# Patient Record
Sex: Female | Born: 1960 | Race: White | Hispanic: No | Marital: Married | Smoking: Current every day smoker
Health system: Southern US, Community
[De-identification: ages and names within clinical notes are randomized; demographics above are authoritative.]

## PROBLEM LIST (undated history)

## (undated) DIAGNOSIS — F419 Anxiety disorder, unspecified: Secondary | ICD-10-CM

## (undated) DIAGNOSIS — K219 Gastro-esophageal reflux disease without esophagitis: Secondary | ICD-10-CM

## (undated) DIAGNOSIS — I10 Essential (primary) hypertension: Secondary | ICD-10-CM

## (undated) DIAGNOSIS — E7800 Pure hypercholesterolemia, unspecified: Secondary | ICD-10-CM

## (undated) HISTORY — DX: Anxiety disorder, unspecified: F41.9

## (undated) HISTORY — PX: CHOLECYSTECTOMY: SHX55

## (undated) HISTORY — PX: ABDOMINAL HYSTERECTOMY: SHX81

## (undated) HISTORY — PX: TONSILLECTOMY AND ADENOIDECTOMY: SUR1326

## (undated) HISTORY — PX: DENTAL SURGERY: SHX609

---

## 1999-05-17 ENCOUNTER — Other Ambulatory Visit: Admission: RE | Admit: 1999-05-17 | Discharge: 1999-05-17 | Payer: Self-pay | Admitting: Gynecology

## 1999-05-21 ENCOUNTER — Other Ambulatory Visit: Admission: RE | Admit: 1999-05-21 | Discharge: 1999-05-21 | Payer: Self-pay | Admitting: Gynecology

## 2002-02-15 ENCOUNTER — Encounter: Admission: RE | Admit: 2002-02-15 | Discharge: 2002-02-15 | Payer: Self-pay | Admitting: Family Medicine

## 2002-02-15 ENCOUNTER — Encounter: Payer: Self-pay | Admitting: Family Medicine

## 2002-10-18 ENCOUNTER — Ambulatory Visit (HOSPITAL_COMMUNITY): Admission: RE | Admit: 2002-10-18 | Discharge: 2002-10-18 | Payer: Self-pay | Admitting: Gastroenterology

## 2002-11-04 ENCOUNTER — Other Ambulatory Visit: Admission: RE | Admit: 2002-11-04 | Discharge: 2002-11-04 | Payer: Self-pay | Admitting: Gynecology

## 2002-11-07 ENCOUNTER — Encounter (HOSPITAL_COMMUNITY): Admission: RE | Admit: 2002-11-07 | Discharge: 2002-11-08 | Payer: Self-pay | Admitting: Family Medicine

## 2002-12-30 ENCOUNTER — Encounter: Payer: Self-pay | Admitting: Gynecology

## 2003-01-02 ENCOUNTER — Inpatient Hospital Stay (HOSPITAL_COMMUNITY): Admission: RE | Admit: 2003-01-02 | Discharge: 2003-01-03 | Payer: Self-pay | Admitting: Gynecology

## 2003-08-29 ENCOUNTER — Ambulatory Visit (HOSPITAL_COMMUNITY): Admission: RE | Admit: 2003-08-29 | Discharge: 2003-08-29 | Payer: Self-pay | Admitting: Gastroenterology

## 2003-09-02 ENCOUNTER — Ambulatory Visit (HOSPITAL_COMMUNITY): Admission: RE | Admit: 2003-09-02 | Discharge: 2003-09-02 | Payer: Self-pay | Admitting: Gastroenterology

## 2003-09-18 ENCOUNTER — Observation Stay (HOSPITAL_COMMUNITY): Admission: RE | Admit: 2003-09-18 | Discharge: 2003-09-18 | Payer: Self-pay | Admitting: Surgery

## 2003-11-26 ENCOUNTER — Other Ambulatory Visit: Admission: RE | Admit: 2003-11-26 | Discharge: 2003-11-26 | Payer: Self-pay | Admitting: Gynecology

## 2004-12-06 ENCOUNTER — Encounter: Admission: RE | Admit: 2004-12-06 | Discharge: 2004-12-06 | Payer: Self-pay | Admitting: Gastroenterology

## 2005-01-04 ENCOUNTER — Other Ambulatory Visit: Admission: RE | Admit: 2005-01-04 | Discharge: 2005-01-04 | Payer: Self-pay | Admitting: Gynecology

## 2006-06-05 ENCOUNTER — Emergency Department (HOSPITAL_COMMUNITY): Admission: EM | Admit: 2006-06-05 | Discharge: 2006-06-05 | Payer: Self-pay | Admitting: Emergency Medicine

## 2011-03-21 ENCOUNTER — Emergency Department (HOSPITAL_COMMUNITY): Payer: BC Managed Care – PPO

## 2011-03-21 ENCOUNTER — Emergency Department (HOSPITAL_COMMUNITY)
Admission: EM | Admit: 2011-03-21 | Discharge: 2011-03-21 | Disposition: A | Discharge status: Home / Self Care | Payer: BC Managed Care – PPO | Attending: Emergency Medicine | Admitting: Emergency Medicine

## 2011-03-21 DIAGNOSIS — R112 Nausea with vomiting, unspecified: Secondary | ICD-10-CM | POA: Insufficient documentation

## 2011-03-21 DIAGNOSIS — R1013 Epigastric pain: Secondary | ICD-10-CM | POA: Insufficient documentation

## 2011-03-21 DIAGNOSIS — R1031 Right lower quadrant pain: Secondary | ICD-10-CM | POA: Insufficient documentation

## 2011-03-21 DIAGNOSIS — R197 Diarrhea, unspecified: Secondary | ICD-10-CM | POA: Insufficient documentation

## 2011-03-21 DIAGNOSIS — K7689 Other specified diseases of liver: Secondary | ICD-10-CM | POA: Insufficient documentation

## 2011-03-21 DIAGNOSIS — I10 Essential (primary) hypertension: Secondary | ICD-10-CM | POA: Insufficient documentation

## 2011-03-21 DIAGNOSIS — E785 Hyperlipidemia, unspecified: Secondary | ICD-10-CM | POA: Insufficient documentation

## 2011-03-21 LAB — COMPREHENSIVE METABOLIC PANEL
ALT: 65 U/L — ABNORMAL HIGH (ref 0–35)
Alkaline Phosphatase: 86 U/L (ref 39–117)
CO2: 25 mEq/L (ref 19–32)
Chloride: 99 mEq/L (ref 96–112)
GFR calc Af Amer: >60 mL/min (ref >60)
Glucose, Bld: 123 mg/dL — ABNORMAL HIGH (ref 70–99)
Potassium: 4.1 mEq/L (ref 3.5–5.1)
Sodium: 137 mEq/L (ref 135–145)
Total Protein: 7.7 g/dL (ref 6.0–8.3)

## 2011-03-21 LAB — CBC
Hemoglobin: 16.1 g/dL — ABNORMAL HIGH (ref 12.0–15.0)
MCHC: 34.7 g/dL (ref 30.0–36.0)
RBC: 5.43 MIL/uL — ABNORMAL HIGH (ref 3.87–5.11)
WBC: 14.3 10*3/uL — ABNORMAL HIGH (ref 4.0–10.5)

## 2011-03-21 LAB — URINALYSIS, ROUTINE W REFLEX MICROSCOPIC
Hgb urine dipstick: NEGATIVE
Specific Gravity, Urine: 1.026 (ref 1.005–1.030)
Urobilinogen, UA: 1 mg/dL (ref 0.0–1.0)

## 2011-03-21 LAB — DIFFERENTIAL
Basophils Absolute: 0 10*3/uL (ref 0.0–0.1)
Basophils Relative: 0 % (ref 0–1)
Neutro Abs: 11.8 10*3/uL — ABNORMAL HIGH (ref 1.7–7.7)
Neutrophils Relative %: 83 % — ABNORMAL HIGH (ref 43–77)

## 2011-03-21 MED ORDER — IOHEXOL 300 MG/ML  SOLN
100.0000 mL | Freq: Once | INTRAMUSCULAR | Status: AC | PRN
  Administered 2011-03-21: 100 mL via INTRAVENOUS

## 2011-11-01 ENCOUNTER — Other Ambulatory Visit: Payer: Self-pay | Admitting: Dermatology

## 2012-01-03 ENCOUNTER — Other Ambulatory Visit: Payer: Self-pay | Admitting: *Deleted

## 2012-01-03 DIAGNOSIS — R1011 Right upper quadrant pain: Secondary | ICD-10-CM

## 2012-01-04 ENCOUNTER — Ambulatory Visit
Admission: RE | Admit: 2012-01-04 | Discharge: 2012-01-04 | Disposition: A | Discharge status: Home / Self Care | Payer: BC Managed Care – PPO | Source: Ambulatory Visit | Attending: *Deleted | Admitting: *Deleted

## 2012-01-04 DIAGNOSIS — R1011 Right upper quadrant pain: Secondary | ICD-10-CM

## 2012-05-23 ENCOUNTER — Encounter (HOSPITAL_COMMUNITY): Payer: Self-pay

## 2012-05-23 ENCOUNTER — Emergency Department (HOSPITAL_COMMUNITY): Payer: BC Managed Care – PPO

## 2012-05-23 ENCOUNTER — Emergency Department (HOSPITAL_COMMUNITY)
Admission: EM | Admit: 2012-05-23 | Discharge: 2012-05-23 | Disposition: A | Discharge status: Home / Self Care | Payer: BC Managed Care – PPO | Attending: Emergency Medicine | Admitting: Emergency Medicine

## 2012-05-23 DIAGNOSIS — I10 Essential (primary) hypertension: Secondary | ICD-10-CM | POA: Insufficient documentation

## 2012-05-23 DIAGNOSIS — R0789 Other chest pain: Secondary | ICD-10-CM

## 2012-05-23 DIAGNOSIS — R079 Chest pain, unspecified: Secondary | ICD-10-CM | POA: Insufficient documentation

## 2012-05-23 DIAGNOSIS — R0602 Shortness of breath: Secondary | ICD-10-CM | POA: Insufficient documentation

## 2012-05-23 DIAGNOSIS — E7800 Pure hypercholesterolemia, unspecified: Secondary | ICD-10-CM | POA: Insufficient documentation

## 2012-05-23 DIAGNOSIS — K219 Gastro-esophageal reflux disease without esophagitis: Secondary | ICD-10-CM | POA: Insufficient documentation

## 2012-05-23 DIAGNOSIS — Z79899 Other long term (current) drug therapy: Secondary | ICD-10-CM | POA: Insufficient documentation

## 2012-05-23 HISTORY — DX: Pure hypercholesterolemia, unspecified: E78.00

## 2012-05-23 HISTORY — DX: Gastro-esophageal reflux disease without esophagitis: K21.9

## 2012-05-23 HISTORY — DX: Essential (primary) hypertension: I10

## 2012-05-23 LAB — CBC WITH DIFFERENTIAL/PLATELET
Eosinophils Absolute: 0.3 10*3/uL (ref 0.0–0.7)
Eosinophils Relative: 3 % (ref 0–5)
Hemoglobin: 15.6 g/dL — ABNORMAL HIGH (ref 12.0–15.0)
Lymphs Abs: 2.4 10*3/uL (ref 0.7–4.0)
MCH: 29.4 pg (ref 26.0–34.0)
MCV: 85.7 fL (ref 78.0–100.0)
Monocytes Relative: 5 % (ref 3–12)
Platelets: 301 10*3/uL (ref 150–400)
RBC: 5.31 MIL/uL — ABNORMAL HIGH (ref 3.87–5.11)

## 2012-05-23 LAB — COMPREHENSIVE METABOLIC PANEL
BUN: 15 mg/dL (ref 6–23)
Calcium: 10 mg/dL (ref 8.4–10.5)
GFR calc Af Amer: >90 mL/min (ref >90)
Glucose, Bld: 101 mg/dL — ABNORMAL HIGH (ref 70–99)
Total Protein: 7.5 g/dL (ref 6.0–8.3)

## 2012-05-23 LAB — TROPONIN I: Troponin I: <0.3 ng/mL (ref <0.30)

## 2012-05-23 MED ORDER — GI COCKTAIL ~~LOC~~
30.0000 mL | Freq: Once | ORAL | Status: AC
  Administered 2012-05-23: 30 mL via ORAL
  Filled 2012-05-23: qty 30

## 2012-05-23 NOTE — ED Notes (Signed)
Alert and oriented x4. No respiratory distress.

## 2012-05-23 NOTE — ED Provider Notes (Signed)
History     CSN: [id number]  Arrival date & time 05/23/12  0445   First MD Initiated Contact with Patient 05/23/12 [phone]      Chief Complaint  Patient presents with  . Chest Pain  . Shortness of Breath    (Consider location/radiation/quality/duration/timing/severity/associated sxs/prior treatment) HPI Comments: Patient woke from sleep with burning in chest and throat, feeling as if she couldn't breathe.  She became very anxious and 911 was called.  She is feeling better now but has never had anything like this before.  She takes prilosec daily.  No recent exertional symptoms.    Patient is a 50 y.o. female presenting with chest pain and shortness of breath. The history is provided by the patient.  Chest Pain Episode onset: 3 AM. Chest pain occurs constantly. The chest pain is unchanged. Associated with: sleeping. The severity of the pain is moderate. The quality of the pain is described as burning. The pain does not radiate. Primary symptoms include shortness of breath.    Shortness of Breath  Associated symptoms include chest pain and shortness of breath.    Past Medical History  Diagnosis Date  . Hypertension   . High cholesterol   . GERD (gastroesophageal reflux disease)     History reviewed. No pertinent past surgical history.  No family history on file.  History  Substance Use Topics  . Smoking status: Not on file  . Smokeless tobacco: Not on file  . Alcohol Use:     OB History    Grav Para Term Preterm Abortions TAB SAB Ect Mult Living                  Review of Systems  Respiratory: Positive for shortness of breath.   Cardiovascular: Positive for chest pain.  All other systems reviewed and are negative.    Allergies  Review of patient's allergies indicates no known allergies.  Home Medications   Current Outpatient Rx  Name Route Sig Dispense Refill  . LIPITOR PO Oral Take 1 tablet by mouth daily.    Marland Kitchen LISINOPRIL PO Oral Take 1 tablet by mouth  daily.      There were no vitals taken for this visit.  Physical Exam  Nursing note and vitals reviewed. Constitutional: She is oriented to person, place, and time. She appears well-developed and well-nourished. No distress.  HENT:  Head: Normocephalic and atraumatic.  Neck: Normal range of motion. Neck supple.  Cardiovascular: Normal rate and regular rhythm.  Exam reveals no gallop and no friction rub.   No murmur heard. Pulmonary/Chest: Effort normal and breath sounds normal. No respiratory distress. She has no wheezes.  Abdominal: Soft. Bowel sounds are normal. She exhibits no distension. There is no tenderness.  Musculoskeletal: Normal range of motion.  Neurological: She is alert and oriented to person, place, and time.  Skin: Skin is warm and dry. She is not diaphoretic.    ED Course  Procedures (including critical care time)  Labs Reviewed - No data to display No results found.   No diagnosis found.   Date: 05/23/2012  Rate: 90  Rhythm: normal sinus rhythm  QRS Axis: normal  Intervals: normal  ST/T Wave abnormalities: normal  Conduction Disutrbances:none  Narrative Interpretation:   Old EKG Reviewed: unchanged    MDM  The patient presents with burning in the chest and feeling short of breath that woke her from sleep.  She ate a plate of spaghetti before going to bed and I believe that  this is likely GI in nature.  Also supporting this is that she was given a GI cocktail which seemed to improve her symptoms.  The labs, ekg, and chest xray are all unremarkable.  I doubt a cardiac etiology.  I will discharge the patient to home with the instructions to increase the prilosec to twice daily.  She is to return prn if worsens.          Geoffery Lyons, MD 05/23/12 [phone]

## 2012-05-23 NOTE — ED Notes (Signed)
Pt. C/o chest pain 3/10. States that she woke from sleep with SOB, nausea, chest pain and diaphoretic. Reports burning pain in central chest, states "it feels like my acid reflux pain". Pt. Anxious. Husband at bedside.

## 2012-05-23 NOTE — ED Notes (Signed)
Per EMS, pt awoke from sleep with nausea, SOB, chest pain 9/10 and diaphoresis. Pt. Took 2, 81mg  ASA and 1 TUMS. Upon EMS arrival pt anxious with no nausea. Given 2 more 81 mg ASA. Pt chest pain 3/10.

## 2013-12-24 ENCOUNTER — Other Ambulatory Visit: Payer: Self-pay | Admitting: Internal Medicine

## 2013-12-24 ENCOUNTER — Ambulatory Visit
Admission: RE | Admit: 2013-12-24 | Discharge: 2013-12-24 | Disposition: A | Discharge status: Home / Self Care | Payer: BC Managed Care – PPO | Source: Ambulatory Visit | Attending: Internal Medicine | Admitting: Internal Medicine

## 2013-12-24 DIAGNOSIS — M545 Low back pain, unspecified: Secondary | ICD-10-CM

## 2013-12-27 ENCOUNTER — Other Ambulatory Visit: Payer: Self-pay | Admitting: Internal Medicine

## 2013-12-27 DIAGNOSIS — R109 Unspecified abdominal pain: Secondary | ICD-10-CM

## 2013-12-30 ENCOUNTER — Other Ambulatory Visit: Payer: BC Managed Care – PPO

## 2014-01-02 ENCOUNTER — Ambulatory Visit
Admission: RE | Admit: 2014-01-02 | Discharge: 2014-01-02 | Disposition: A | Discharge status: Home / Self Care | Payer: BC Managed Care – PPO | Source: Ambulatory Visit | Attending: Internal Medicine | Admitting: Internal Medicine

## 2014-01-02 ENCOUNTER — Inpatient Hospital Stay: Admission: RE | Admit: 2014-01-02 | Payer: BC Managed Care – PPO | Source: Ambulatory Visit

## 2014-01-02 DIAGNOSIS — R109 Unspecified abdominal pain: Secondary | ICD-10-CM

## 2014-01-02 MED ORDER — IOHEXOL 300 MG/ML  SOLN
100.0000 mL | Freq: Once | INTRAMUSCULAR | Status: AC | PRN
  Administered 2014-01-02: 100 mL via INTRAVENOUS

## 2016-04-29 DIAGNOSIS — K219 Gastro-esophageal reflux disease without esophagitis: Secondary | ICD-10-CM | POA: Diagnosis not present

## 2016-04-29 DIAGNOSIS — I10 Essential (primary) hypertension: Secondary | ICD-10-CM | POA: Diagnosis not present

## 2016-04-29 DIAGNOSIS — E7800 Pure hypercholesterolemia, unspecified: Secondary | ICD-10-CM | POA: Diagnosis not present

## 2016-04-29 DIAGNOSIS — F17200 Nicotine dependence, unspecified, uncomplicated: Secondary | ICD-10-CM | POA: Diagnosis not present

## 2016-09-02 DIAGNOSIS — Z0100 Encounter for examination of eyes and vision without abnormal findings: Secondary | ICD-10-CM | POA: Diagnosis not present

## 2016-12-07 ENCOUNTER — Ambulatory Visit: Payer: Self-pay | Admitting: Diagnostic Neuroimaging

## 2016-12-22 ENCOUNTER — Encounter: Payer: Self-pay | Admitting: *Deleted

## 2016-12-23 ENCOUNTER — Encounter: Payer: Self-pay | Admitting: Diagnostic Neuroimaging

## 2016-12-23 ENCOUNTER — Ambulatory Visit (INDEPENDENT_AMBULATORY_CARE_PROVIDER_SITE_OTHER): Payer: BLUE CROSS/BLUE SHIELD | Admitting: Diagnostic Neuroimaging

## 2016-12-23 ENCOUNTER — Encounter (INDEPENDENT_AMBULATORY_CARE_PROVIDER_SITE_OTHER): Payer: Self-pay

## 2016-12-23 VITALS — BP 126/84 | HR 92 | Ht 66.0 in | Wt 185.6 lb

## 2016-12-23 DIAGNOSIS — G508 Other disorders of trigeminal nerve: Secondary | ICD-10-CM

## 2016-12-23 NOTE — Patient Instructions (Signed)
Thank you for coming to see [place] at [facility]. I hope we have been able to provide you high quality care today.  You may receive a patient satisfaction survey over the next few weeks. We would appreciate your feedback and comments so that we may continue to improve ourselves and the health of our patients.   - monitor symptoms for now; hopefully symptoms will gradually improve over next 3-6 months    ~~~~~~~~~~~~~~~~~~~~~~~~~~~~~~~~~~~~~~~~~~~~~~~~~~~~~~~~~~~~~~~~~  DR. PENUMALLI'S GUIDE TO HAPPY AND HEALTHY LIVING These are some of my general health and wellness recommendations. Some of them may apply to you better than others. Please use common sense as you try these suggestions and feel free to ask me any questions.   ACTIVITY/FITNESS Mental, social, emotional and physical stimulation are very important for brain and body health. Try learning a new activity (arts, music, language, sports, games).  Keep moving your body to the best of your abilities. You can do this at home, inside or outside, the park, community center, gym or anywhere you like. Consider a physical therapist or personal trainer to get started. Consider the app Sworkit. Fitness trackers such as smart-watches, smart-phones or Fitbits can help as well.   NUTRITION Eat more plants: colorful vegetables, nuts, seeds and berries.  Eat less sugar, salt, preservatives and processed foods.  Avoid toxins such as cigarettes and alcohol.  Drink water when you are thirsty. Warm water with a slice of lemon is an excellent morning drink to start the day.  Consider these websites for more information The Nutrition Source (https://www.henry-hernandez.biz/) Precision Nutrition (WindowBlog.ch)   RELAXATION Consider practicing mindfulness meditation or other relaxation techniques such as deep breathing, prayer, yoga, tai chi, massage. See website mindful.org or the apps  Headspace or Calm to help get started.   SLEEP Try to get at least 7-8+ hours sleep per day. Regular exercise and reduced caffeine will help you sleep better. Practice good sleep hygeine techniques. See website sleep.org for more information.   PLANNING Prepare estate planning, living will, healthcare POA documents. Sometimes this is best planned with the help of an attorney. Theconversationproject.org and agingwithdignity.org are excellent resources.

## 2016-12-23 NOTE — Progress Notes (Signed)
[facility]  PATIENT: Allison Hensley DOB: [birth year]  REFERRING CLINICIAN: S Rehm HISTORY FROM: patient  REASON FOR VISIT: new consult    HISTORICAL  CHIEF COMPLAINT:  Chief Complaint  Patient presents with  . NP   Rehm  Trigeminal Neuralgia     R sided numbness     HISTORY OF PRESENT ILLNESS:   56 year old right-handed female here for valuation of right facial numbness and sensitivity. 11/04/16 patient went to her general dentist for upper right crown preparation with local anesthetic. Patient felt some fullness and abnormal sensation in the region of the crown procedure. Over the next few days patient had ongoing numbness, tingling, slight weakness in her right upper lip. Patient then referred to oral surgeon who evaluated patient and recommended conservative management. One month after the procedure patient continued to have symptoms of soreness and numbness in the right upper lip and right maxillary region with soreness and pulling sensation. Therefore patient referred to me for further evaluation and consideration of alternate etiologies of symptoms.  Since that time patient symptoms are slightly improving. No problems with her arms or legs. She has noticed some mild intermittent left facial numbness, separate from the right facial numbness that began in the post procedure timeframe in February 2018. Patient has been more anxious and worried about a variety of other symptoms including memory loss, chest pain, hearing loss, fatigue, blurred vision, feeling hot, increased thirst.    REVIEW OF SYSTEMS: Full 14 system review of systems performed and negative with exception of: Only as per history of present illness.  ALLERGIES: No Known Allergies  HOME MEDICATIONS: Outpatient Medications Prior to Visit  Medication Sig Dispense Refill  . Atorvastatin Calcium (LIPITOR PO) Take 40 mg by mouth daily.     Marland Kitchen LISINOPRIL PO Take 1 tablet by mouth daily.     No  facility-administered medications prior to visit.     PAST MEDICAL HISTORY: Past Medical History:  Diagnosis Date  . Anxiety   . GERD (gastroesophageal reflux disease)   . High cholesterol   . Hypertension     PAST SURGICAL HISTORY: Past Surgical History:  Procedure Laterality Date  . ABDOMINAL HYSTERECTOMY    . CHOLECYSTECTOMY    . DENTAL SURGERY     third molars removed  . TONSILLECTOMY AND ADENOIDECTOMY      FAMILY HISTORY: Family History  Problem Relation Age of Onset  . Heart disease Mother   . Heart disease Father     alcoholic    SOCIAL HISTORY:  Social History   Social History  . Marital status: Married    Spouse name: N/A  . Number of children: N/A  . Years of education: N/A   Occupational History  . Not on file.   Social History Main Topics  . Smoking status: Current Every Day Smoker  . Smokeless tobacco: Never Used  . Alcohol use No  . Drug use: No  . Sexual activity: Not on file   Other Topics Concern  . Not on file   Social History Narrative   Pt lives at home with Orvilla Fus her spouse.  Works at [employer].  No children.  2-3 cups coffee daily, tea all the time.       PHYSICAL EXAM  GENERAL EXAM/CONSTITUTIONAL: Vitals:  Vitals:   12/23/16 1039  BP: 126/84  Pulse: 92  Weight: 185 lb 9.6 oz (84.2 kg)  Height:  (1.676 m)     Body mass index is 29.96  kg/m.  No exam data present  Patient is in no distress; well developed, nourished and groomed; neck is supple  CARDIOVASCULAR:  Examination of carotid arteries is normal; no carotid bruits  Regular rate and rhythm, no murmurs  Examination of peripheral vascular system by observation and palpation is normal  EYES:  Ophthalmoscopic exam of optic discs and posterior segments is normal; no papilledema or hemorrhages  MUSCULOSKELETAL:  Gait, strength, tone, movements noted in Neurologic exam below  NEUROLOGIC: MENTAL STATUS:  No flowsheet data found.  awake,  alert, oriented to person, place and time  recent and remote memory intact  normal attention and concentration  language fluent, comprehension intact, naming intact,   fund of knowledge appropriate  CRANIAL NERVE:   2nd - no papilledema on fundoscopic exam  2nd, 3rd, 4th, 6th - pupils equal and reactive to light, visual fields full to confrontation, extraocular muscles intact, no nystagmus  5th - facial sensation --> SYMM TO LT; SLIGHT HYPERSEN IN RIGHT UPPER LIP REGION; ABLE TO FEEL PINPRICK SYMM  7th - facial strength symmetric  8th - hearing intact  9th - palate elevates symmetrically, uvula midline  11th - shoulder shrug symmetric  12th - tongue protrusion midline  MOTOR:   normal bulk and tone, full strength in the BUE, BLE  SENSORY:   normal and symmetric to light touch, pinprick, temperature, vibration  COORDINATION:   finger-nose-finger, fine finger movements normal  REFLEXES:   deep tendon reflexes present and symmetric  GAIT/STATION:   narrow based gait; able to walk tandem; romberg is negative    DIAGNOSTIC DATA (LABS, IMAGING, TESTING) - I reviewed patient records, labs, notes, testing and imaging myself where available.  Lab Results  Component Value Date   WBC 11.8 (H) 05/23/2012   HGB 15.6 (H) 05/23/2012   HCT 45.5 05/23/2012   MCV 85.7 05/23/2012   PLT 301 05/23/2012      Component Value Date/Time   NA 141 05/23/2012 0531   K 3.9 05/23/2012 0531   CL 103 05/23/2012 0531   CO2 28 05/23/2012 0531   GLUCOSE 101 (H) 05/23/2012 0531   BUN 15 05/23/2012 0531   CREATININE 0.80 05/23/2012 0531   CALCIUM 10.0 05/23/2012 0531   PROT 7.5 05/23/2012 0531   ALBUMIN 3.8 05/23/2012 0531   AST 23 05/23/2012 0531   ALT 40 (H) 05/23/2012 0531   ALKPHOS 132 (H) 05/23/2012 0531   BILITOT 0.2 (L) 05/23/2012 0531   GFRNONAA 83 (L) 05/23/2012 0531   GFRAA >90 05/23/2012 0531   No results found for: CHOL, HDL, LDLCALC, LDLDIRECT, TRIG,  CHOLHDL No results found for: ZOXW9U No results found for: VITAMINB12 No results found for: TSH     ASSESSMENT AND PLAN  56 y.o. year old female here with right lip and facial numbness and sensitivity, soreness, immediately following right upper and procedure in 11/04/16, performed with local anesthesia. Neurologic examination notable for slight hyperesthesia in the right upper lip region, but otherwise unremarkable. No strong evidence for an alternate neurologic etiology for right facial numbness, other than possible peri-procedural complication.   Dx: peri-procedural trigeminal nerve numbness / sensitivity (right V2)  1. 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    Suanne Marker, MD 12/23/2016, 11:07 AM Certified in Neurology, Neurophysiology and Neuroimaging  [facility] [address] [place], [place] [address] [phone]

## 2017-06-12 DIAGNOSIS — I10 Essential (primary) hypertension: Secondary | ICD-10-CM | POA: Diagnosis not present

## 2017-06-12 DIAGNOSIS — E7800 Pure hypercholesterolemia, unspecified: Secondary | ICD-10-CM | POA: Diagnosis not present

## 2017-06-12 DIAGNOSIS — I208 Other forms of angina pectoris: Secondary | ICD-10-CM | POA: Diagnosis not present

## 2017-06-12 DIAGNOSIS — I700 Atherosclerosis of aorta: Secondary | ICD-10-CM | POA: Diagnosis not present

## 2017-06-19 DIAGNOSIS — R0602 Shortness of breath: Secondary | ICD-10-CM | POA: Diagnosis not present

## 2017-06-19 DIAGNOSIS — I209 Angina pectoris, unspecified: Secondary | ICD-10-CM | POA: Diagnosis not present

## 2017-06-19 DIAGNOSIS — R0789 Other chest pain: Secondary | ICD-10-CM | POA: Diagnosis not present

## 2017-09-08 DIAGNOSIS — D3131 Benign neoplasm of right choroid: Secondary | ICD-10-CM | POA: Diagnosis not present

## 2017-10-25 ENCOUNTER — Ambulatory Visit
Admission: RE | Admit: 2017-10-25 | Discharge: 2017-10-25 | Disposition: A | Discharge status: Home / Self Care | Payer: BLUE CROSS/BLUE SHIELD | Source: Ambulatory Visit | Attending: Internal Medicine | Admitting: Internal Medicine

## 2017-10-25 ENCOUNTER — Other Ambulatory Visit: Payer: Self-pay | Admitting: Internal Medicine

## 2017-10-25 DIAGNOSIS — R1031 Right lower quadrant pain: Secondary | ICD-10-CM | POA: Diagnosis not present

## 2017-10-25 DIAGNOSIS — J3489 Other specified disorders of nose and nasal sinuses: Secondary | ICD-10-CM | POA: Diagnosis not present

## 2017-10-25 DIAGNOSIS — K5790 Diverticulosis of intestine, part unspecified, without perforation or abscess without bleeding: Secondary | ICD-10-CM | POA: Diagnosis not present

## 2017-10-25 DIAGNOSIS — G8929 Other chronic pain: Secondary | ICD-10-CM | POA: Diagnosis not present

## 2017-10-25 DIAGNOSIS — M5441 Lumbago with sciatica, right side: Secondary | ICD-10-CM | POA: Diagnosis not present

## 2017-10-25 MED ORDER — IOPAMIDOL (ISOVUE-300) INJECTION 61%
100.0000 mL | Freq: Once | INTRAVENOUS | Status: AC | PRN
  Administered 2017-10-25: 100 mL via INTRAVENOUS

## 2017-11-27 ENCOUNTER — Other Ambulatory Visit: Payer: Self-pay | Admitting: Internal Medicine

## 2017-11-27 ENCOUNTER — Ambulatory Visit
Admission: RE | Admit: 2017-11-27 | Discharge: 2017-11-27 | Disposition: A | Discharge status: Home / Self Care | Payer: BLUE CROSS/BLUE SHIELD | Source: Ambulatory Visit | Attending: Internal Medicine | Admitting: Internal Medicine

## 2017-11-27 DIAGNOSIS — M5441 Lumbago with sciatica, right side: Secondary | ICD-10-CM | POA: Diagnosis not present

## 2017-11-27 DIAGNOSIS — M5442 Lumbago with sciatica, left side: Secondary | ICD-10-CM | POA: Diagnosis not present

## 2017-11-27 DIAGNOSIS — M160 Bilateral primary osteoarthritis of hip: Secondary | ICD-10-CM | POA: Diagnosis not present

## 2017-11-27 DIAGNOSIS — R109 Unspecified abdominal pain: Secondary | ICD-10-CM | POA: Diagnosis not present

## 2018-03-16 DIAGNOSIS — D3131 Benign neoplasm of right choroid: Secondary | ICD-10-CM | POA: Diagnosis not present

## 2018-06-29 DIAGNOSIS — J019 Acute sinusitis, unspecified: Secondary | ICD-10-CM | POA: Diagnosis not present

## 2018-07-20 DIAGNOSIS — J209 Acute bronchitis, unspecified: Secondary | ICD-10-CM | POA: Diagnosis not present

## 2018-07-20 DIAGNOSIS — B370 Candidal stomatitis: Secondary | ICD-10-CM | POA: Diagnosis not present

## 2018-08-19 DIAGNOSIS — J209 Acute bronchitis, unspecified: Secondary | ICD-10-CM | POA: Diagnosis not present

## 2018-09-14 DIAGNOSIS — H527 Unspecified disorder of refraction: Secondary | ICD-10-CM | POA: Diagnosis not present

## 2019-03-18 ENCOUNTER — Other Ambulatory Visit: Payer: Self-pay | Admitting: Internal Medicine

## 2019-03-18 DIAGNOSIS — E782 Mixed hyperlipidemia: Secondary | ICD-10-CM

## 2019-03-29 ENCOUNTER — Ambulatory Visit
Admission: RE | Admit: 2019-03-29 | Discharge: 2019-03-29 | Disposition: A | Discharge status: Home / Self Care | Payer: BLUE CROSS/BLUE SHIELD | Source: Ambulatory Visit | Attending: Internal Medicine | Admitting: Internal Medicine

## 2019-03-29 DIAGNOSIS — E782 Mixed hyperlipidemia: Secondary | ICD-10-CM

## 2019-06-14 ENCOUNTER — Other Ambulatory Visit: Payer: Self-pay | Admitting: Internal Medicine

## 2019-06-14 DIAGNOSIS — F17200 Nicotine dependence, unspecified, uncomplicated: Secondary | ICD-10-CM | POA: Diagnosis not present

## 2019-06-14 DIAGNOSIS — K219 Gastro-esophageal reflux disease without esophagitis: Secondary | ICD-10-CM | POA: Diagnosis not present

## 2019-06-14 DIAGNOSIS — E782 Mixed hyperlipidemia: Secondary | ICD-10-CM | POA: Diagnosis not present

## 2019-06-14 DIAGNOSIS — I10 Essential (primary) hypertension: Secondary | ICD-10-CM | POA: Diagnosis not present

## 2019-06-14 DIAGNOSIS — R911 Solitary pulmonary nodule: Secondary | ICD-10-CM

## 2019-09-11 ENCOUNTER — Other Ambulatory Visit: Payer: Self-pay | Admitting: Internal Medicine

## 2019-09-17 ENCOUNTER — Other Ambulatory Visit: Payer: No Typology Code available for payment source

## 2019-09-17 ENCOUNTER — Ambulatory Visit
Admission: RE | Admit: 2019-09-17 | Discharge: 2019-09-17 | Disposition: A | Discharge status: Home / Self Care | Payer: BC Managed Care – PPO | Source: Ambulatory Visit | Attending: Internal Medicine | Admitting: Internal Medicine

## 2019-09-17 DIAGNOSIS — R918 Other nonspecific abnormal finding of lung field: Secondary | ICD-10-CM | POA: Diagnosis not present

## 2019-09-17 DIAGNOSIS — R911 Solitary pulmonary nodule: Secondary | ICD-10-CM

## 2019-11-09 IMAGING — CT CT ABD-PELV W/ CM
1 of 3 series · 14 of 32 positions shown, 19 images · IV contrast (APPLIED)
Comparison: CT abdomen and pelvis 01/02/2014.

CLINICAL DATA: Right lower quadrant pain for 3 days.

EXAM:
CT ABDOMEN AND PELVIS WITH CONTRAST
TECHNIQUE: Multidetector CT imaging of the abdomen and pelvis was performed
using the standard protocol following bolus administration of
intravenous contrast.
CONTRAST:  100 ml 8FZFAH-APP IOPAMIDOL (8FZFAH-APP) INJECTION 61%

[Series 2: abd/pelvis w/cm · axial · 0.82mm/px · z∈[-470,-20]mm · 14 of 102 slices shown, 19 images]
[im 6/102  soft-tissue]
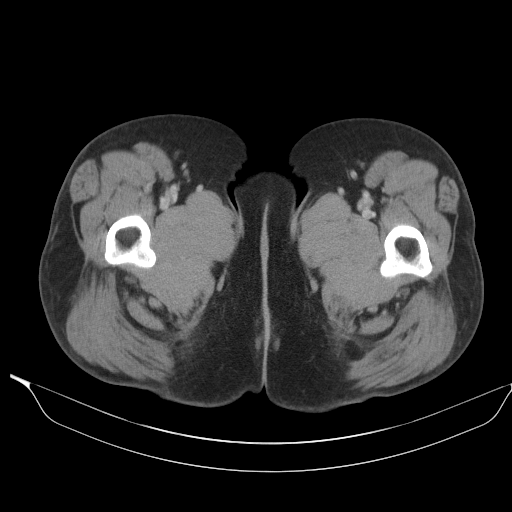
[im 6/102  bone]
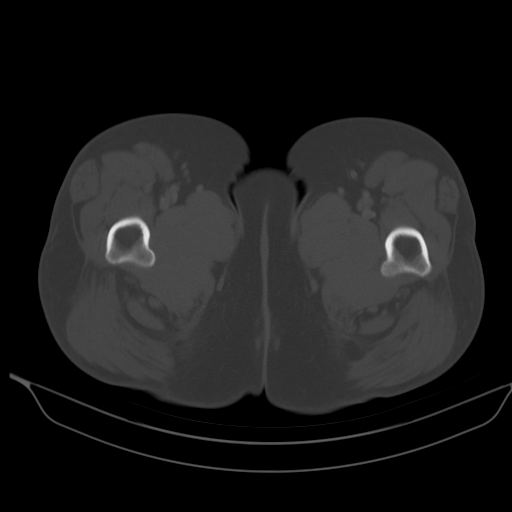
[im 16/102  soft-tissue]
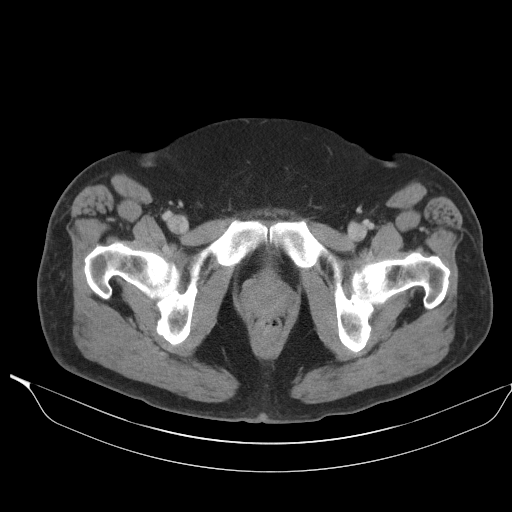
[im 22/102  soft-tissue]
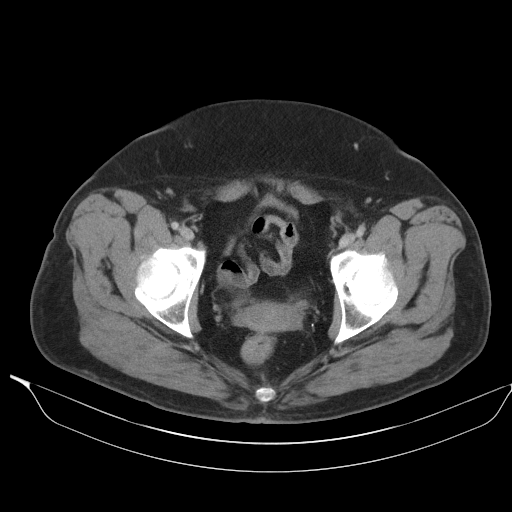
[im 27/102  soft-tissue]
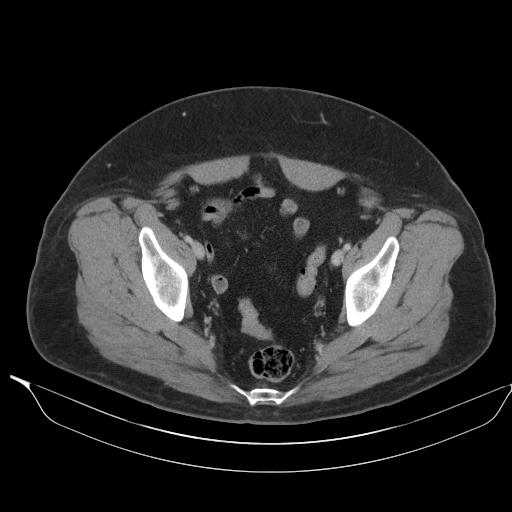
[im 38/102  soft-tissue]
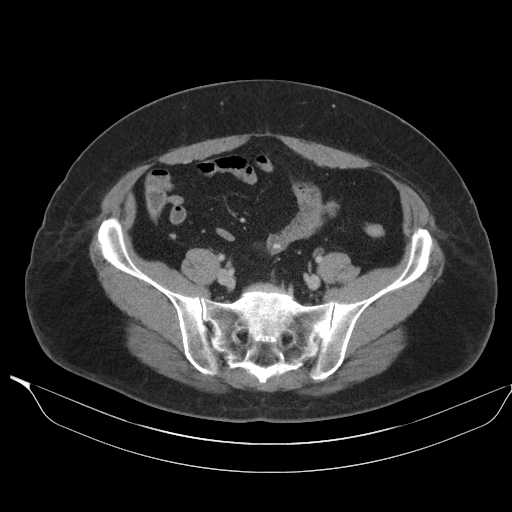
[im 43/102  soft-tissue]
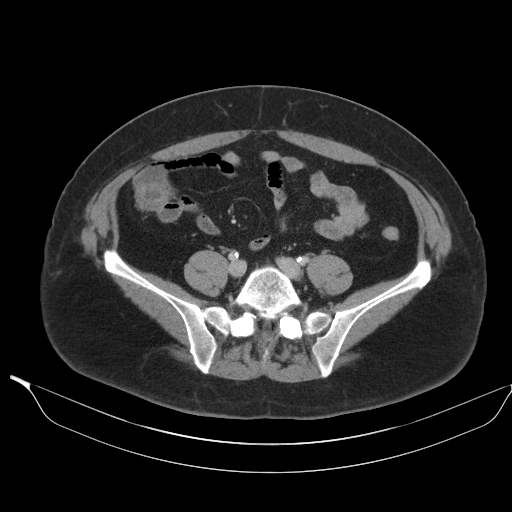
[im 54/102  soft-tissue]
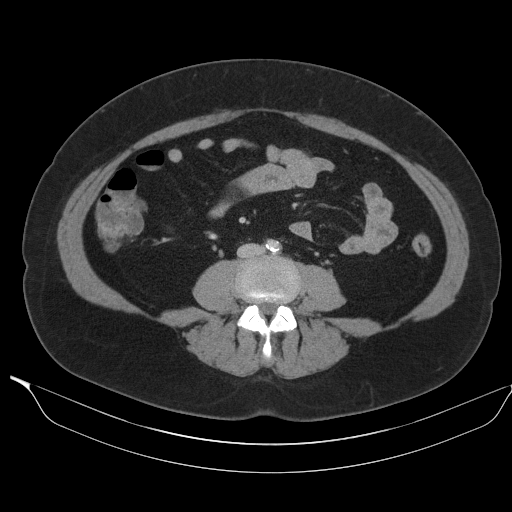
[im 59/102  soft-tissue]
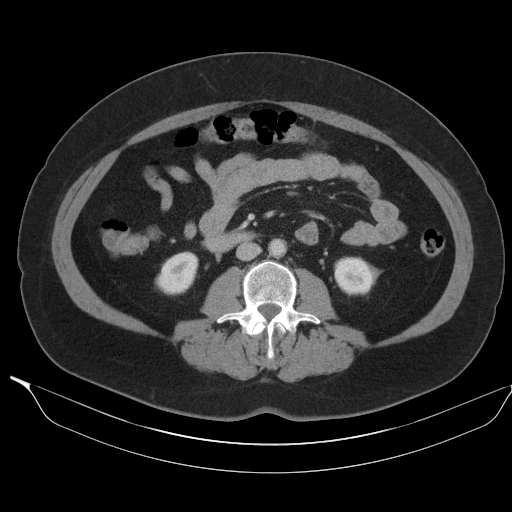
[im 64/102  soft-tissue]
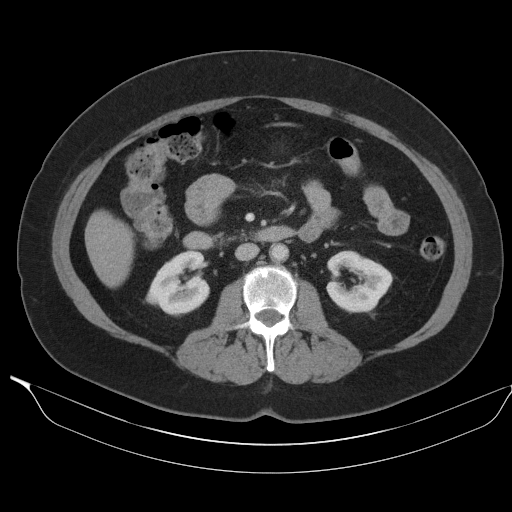
[im 64/102  bone]
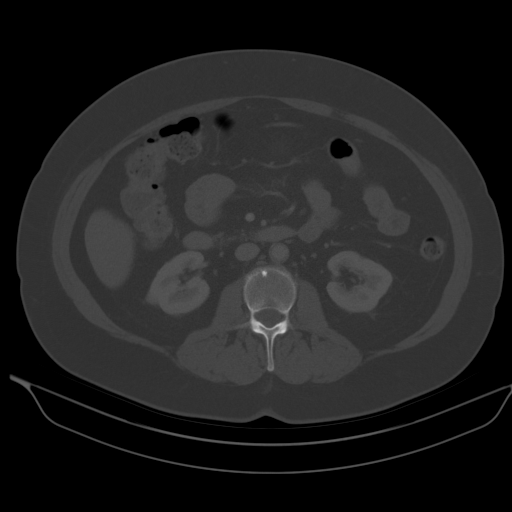
[im 75/102  soft-tissue]
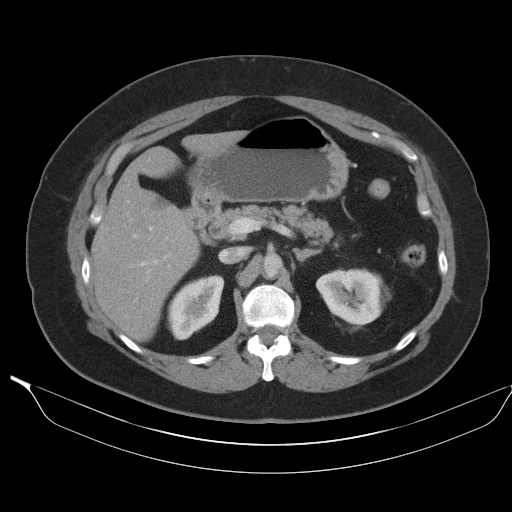
[im 80/102  soft-tissue]
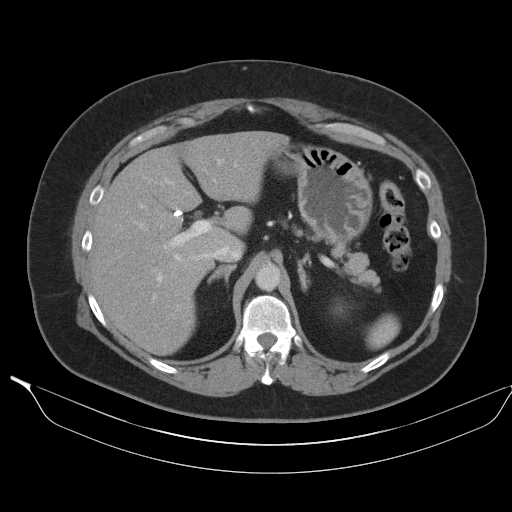
[im 80/102  lung]
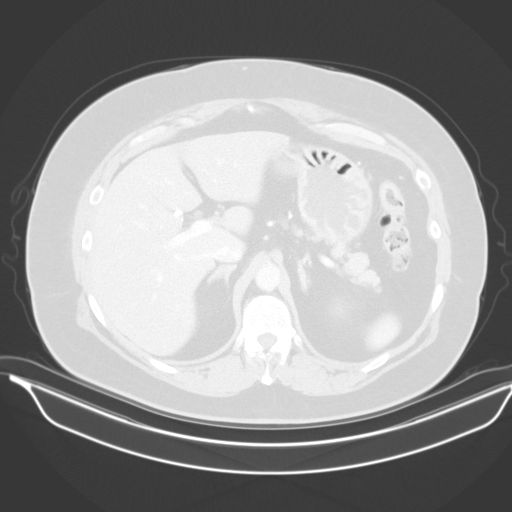
[im 86/102  soft-tissue]
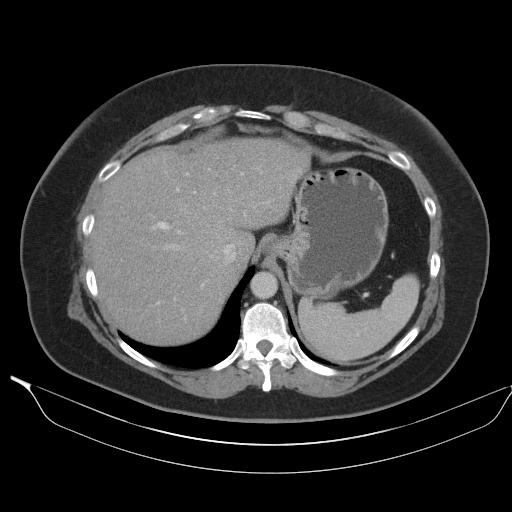
[im 86/102  lung]
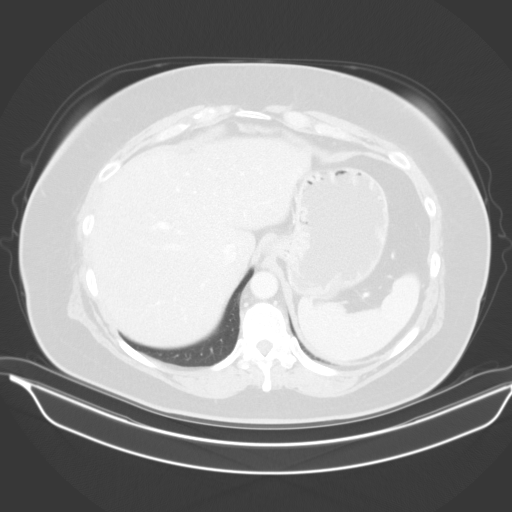
[im 91/102  lung]
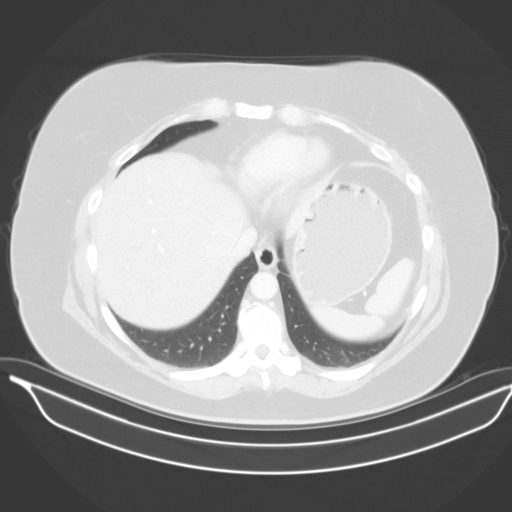
[im 96/102  soft-tissue]
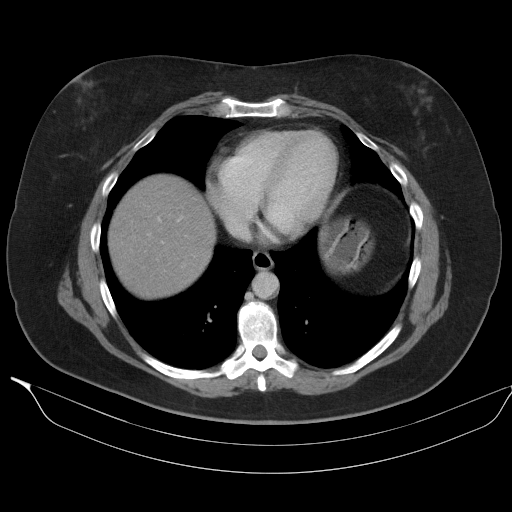
[im 96/102  lung]
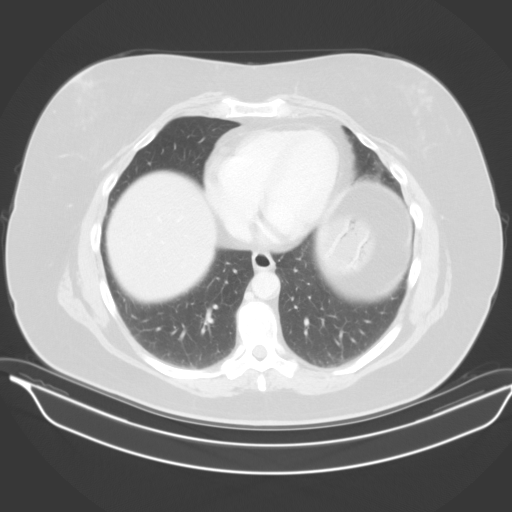

[14 of 32 positions shown; findings below may reference images not displayed]

FINDINGS: Lower chest: Heart size is normal. No pleural or pericardial
effusion. 0.4 cm nodule in the right middle lobe on image 1 is
unchanged.

Hepatobiliary: There is diffuse fatty infiltration of the liver
without focal lesion. The patient is status post cholecystectomy.
Biliary tree appears normal.

Pancreas: Unremarkable. No pancreatic ductal dilatation or
surrounding inflammatory changes.

Spleen: Normal in size without focal abnormality.

Adrenals/Urinary Tract: 0.9 cm in diameter low attenuating left
adrenal nodule consistent with an adenoma is unchanged. The right
adrenal gland appears normal. Tiny cyst in the upper pole of the
left kidney is unchanged. Duplicated intrarenal collecting systems
and proximal ureters bilaterally noted. The kidneys otherwise appear
normal. Urinary bladder are unremarkable.

Stomach/Bowel: The appendix is well seen and normal. Mild sigmoid
diverticulosis without diverticulitis is noted. The colon is
otherwise normal. The stomach and small bowel are normal.

Vascular/Lymphatic: Aortic atherosclerosis. No enlarged abdominal or
pelvic lymph nodes.

Reproductive: Status post hysterectomy. No adnexal masses.

Other: No ascites.

Musculoskeletal: No lytic or sclerotic lesion. Scattered mild
thoracic and lumbar degenerative change noted.
IMPRESSION: Negative for appendicitis. No acute abnormality or finding to
explain the patient's symptoms.

Fatty infiltration of the liver.

Atherosclerosis.

Mild diverticulosis without diverticulitis.

## 2019-12-05 DIAGNOSIS — D3131 Benign neoplasm of right choroid: Secondary | ICD-10-CM | POA: Diagnosis not present

## 2019-12-26 DIAGNOSIS — F17200 Nicotine dependence, unspecified, uncomplicated: Secondary | ICD-10-CM | POA: Diagnosis not present

## 2019-12-26 DIAGNOSIS — E669 Obesity, unspecified: Secondary | ICD-10-CM | POA: Diagnosis not present

## 2019-12-26 DIAGNOSIS — F419 Anxiety disorder, unspecified: Secondary | ICD-10-CM | POA: Diagnosis not present

## 2019-12-26 DIAGNOSIS — F320 Major depressive disorder, single episode, mild: Secondary | ICD-10-CM | POA: Diagnosis not present

## 2020-01-24 DIAGNOSIS — K219 Gastro-esophageal reflux disease without esophagitis: Secondary | ICD-10-CM | POA: Diagnosis not present

## 2020-01-24 DIAGNOSIS — F3341 Major depressive disorder, recurrent, in partial remission: Secondary | ICD-10-CM | POA: Diagnosis not present

## 2020-01-24 DIAGNOSIS — F17200 Nicotine dependence, unspecified, uncomplicated: Secondary | ICD-10-CM | POA: Diagnosis not present

## 2020-04-20 ENCOUNTER — Encounter: Payer: Self-pay | Admitting: Physician Assistant

## 2020-04-20 ENCOUNTER — Ambulatory Visit: Payer: BC Managed Care – PPO | Admitting: Physician Assistant

## 2020-04-20 ENCOUNTER — Ambulatory Visit: Payer: Self-pay

## 2020-04-20 DIAGNOSIS — M25512 Pain in left shoulder: Secondary | ICD-10-CM

## 2020-04-20 DIAGNOSIS — G8929 Other chronic pain: Secondary | ICD-10-CM | POA: Diagnosis not present

## 2020-04-20 NOTE — Progress Notes (Signed)
Office Visit Note   Patient: ABCDE ONEIL           Date of Birth: 08-01-1961           MRN: [id number] Visit Date: 04/20/2020              Requested by: Kirby Funk, MD [address] [place],  [place] [address]     PCP: Kirby Funk, MD   Assessment & Plan: Visit Diagnoses:  1. Chronic left shoulder pain     Plan: She shown forward flexion exercises, Codman exercises, pendulum exercises and wall crawls which she will perform on her own at home.  We will see her back in 2 weeks see what type of response she had to the injection.  Questions were encouraged and answered at length.  Follow-Up Instructions: Return in about 2 weeks (around 05/04/2020).   Orders:  Orders Placed This Encounter  Procedures  . XR Shoulder Left   No orders of the defined types were placed in this encounter.     Procedures: No procedures performed   Clinical Data: No additional findings.   Subjective: Chief Complaint  Patient presents with  . Left Shoulder - Pain    HPI Mrs. Angeletti is a pleasant 59 year old female were seen for the first time today for left shoulder pain has been ongoing for the last month no known injury.  Pain is becoming progressively worse.  She has had no numbness tingling down the arm.  Occasionally has some numbness tingling in her hand.  She notes decreased range of motion of the left shoulder and is unable to reach behind her.  She has tried Tylenol and Aleve without any real relief.  She tried ice and heat which have provided some relief.  No previous surgeries or injections.  She is nondiabetic. Review of Systems Positive for left shoulder pain.  Negative for fevers chills shortness of breath chest pain.  Objective: Vital Signs: There were no vitals taken for this visit.  Physical Exam Constitutional:      Appearance: She is not ill-appearing or diaphoretic.  Pulmonary:     Effort: Pulmonary effort is normal.  Neurological:     Mental  Status: She is alert and oriented to person, place, and time.  Psychiatric:        Behavior: Behavior normal.     Ortho Exam Bilateral shoulders 5 5 strength with external and internal rotation with resistance.  Empty can test negative bilaterally.  Positive impingement testing on the left negative on the right.  Able to perform liftoff test on the right which is negative.  Unable to perform on the left side due to pain and decreased motion. Specialty Comments:  No specialty comments available.  Imaging: XR Shoulder Left  Result Date: 04/20/2020 Left shoulder 3 views: Shoulders well located.  No acute fractures no bony abnormalities.  Normal bone density.  Subacromial space is well-maintained.    PMFS History: There are no problems to display for this patient.  Past Medical History:  Diagnosis Date  . Anxiety   . GERD (gastroesophageal reflux disease)   . High cholesterol   . Hypertension     Family History  Problem Relation Age of Onset  . Heart disease Mother   . Heart disease Father        alcoholic    Past Surgical History:  Procedure Laterality Date  . ABDOMINAL HYSTERECTOMY    . CHOLECYSTECTOMY    . DENTAL SURGERY  third molars removed  . TONSILLECTOMY AND ADENOIDECTOMY     Social History   Occupational History  . Not on file  Tobacco Use  . Smoking status: Current Every Day Smoker  . Smokeless tobacco: Never Used  Substance and Sexual Activity  . Alcohol use: No  . Drug use: No  . Sexual activity: Not on file

## 2020-05-04 ENCOUNTER — Ambulatory Visit: Payer: BC Managed Care – PPO | Admitting: Physician Assistant

## 2021-01-07 DIAGNOSIS — R0683 Snoring: Secondary | ICD-10-CM | POA: Diagnosis not present

## 2021-01-07 DIAGNOSIS — J3089 Other allergic rhinitis: Secondary | ICD-10-CM | POA: Diagnosis not present

## 2021-01-07 DIAGNOSIS — H9193 Unspecified hearing loss, bilateral: Secondary | ICD-10-CM | POA: Diagnosis not present

## 2021-02-11 DIAGNOSIS — F439 Reaction to severe stress, unspecified: Secondary | ICD-10-CM | POA: Diagnosis not present

## 2021-02-11 DIAGNOSIS — L920 Granuloma annulare: Secondary | ICD-10-CM | POA: Diagnosis not present

## 2021-02-11 DIAGNOSIS — J3089 Other allergic rhinitis: Secondary | ICD-10-CM | POA: Diagnosis not present

## 2021-04-20 DIAGNOSIS — E669 Obesity, unspecified: Secondary | ICD-10-CM | POA: Diagnosis not present

## 2021-04-20 DIAGNOSIS — E782 Mixed hyperlipidemia: Secondary | ICD-10-CM | POA: Diagnosis not present

## 2021-04-20 DIAGNOSIS — Z6832 Body mass index (BMI) 32.0-32.9, adult: Secondary | ICD-10-CM | POA: Diagnosis not present

## 2021-04-20 DIAGNOSIS — I700 Atherosclerosis of aorta: Secondary | ICD-10-CM | POA: Diagnosis not present

## 2021-05-28 DIAGNOSIS — E782 Mixed hyperlipidemia: Secondary | ICD-10-CM | POA: Diagnosis not present

## 2021-05-28 DIAGNOSIS — K219 Gastro-esophageal reflux disease without esophagitis: Secondary | ICD-10-CM | POA: Diagnosis not present

## 2021-05-28 DIAGNOSIS — R946 Abnormal results of thyroid function studies: Secondary | ICD-10-CM | POA: Diagnosis not present

## 2021-05-28 DIAGNOSIS — I700 Atherosclerosis of aorta: Secondary | ICD-10-CM | POA: Diagnosis not present

## 2021-05-28 DIAGNOSIS — I10 Essential (primary) hypertension: Secondary | ICD-10-CM | POA: Diagnosis not present

## 2021-05-28 DIAGNOSIS — Z0000 Encounter for general adult medical examination without abnormal findings: Secondary | ICD-10-CM | POA: Diagnosis not present

## 2021-06-01 ENCOUNTER — Other Ambulatory Visit: Payer: Self-pay | Admitting: Internal Medicine

## 2021-06-01 DIAGNOSIS — R911 Solitary pulmonary nodule: Secondary | ICD-10-CM

## 2021-06-14 ENCOUNTER — Ambulatory Visit
Admission: RE | Admit: 2021-06-14 | Discharge: 2021-06-14 | Disposition: A | Discharge status: Home / Self Care | Payer: BC Managed Care – PPO | Source: Ambulatory Visit | Attending: Internal Medicine | Admitting: Internal Medicine

## 2021-06-14 ENCOUNTER — Other Ambulatory Visit: Payer: Self-pay

## 2021-06-14 DIAGNOSIS — R918 Other nonspecific abnormal finding of lung field: Secondary | ICD-10-CM | POA: Diagnosis not present

## 2021-06-14 DIAGNOSIS — J439 Emphysema, unspecified: Secondary | ICD-10-CM | POA: Diagnosis not present

## 2021-06-14 DIAGNOSIS — R911 Solitary pulmonary nodule: Secondary | ICD-10-CM

## 2021-06-14 DIAGNOSIS — I700 Atherosclerosis of aorta: Secondary | ICD-10-CM | POA: Diagnosis not present

## 2021-08-25 DIAGNOSIS — F418 Other specified anxiety disorders: Secondary | ICD-10-CM | POA: Diagnosis not present

## 2021-08-25 DIAGNOSIS — F4321 Adjustment disorder with depressed mood: Secondary | ICD-10-CM | POA: Diagnosis not present

## 2021-11-15 DIAGNOSIS — R21 Rash and other nonspecific skin eruption: Secondary | ICD-10-CM | POA: Diagnosis not present

## 2021-11-15 DIAGNOSIS — F320 Major depressive disorder, single episode, mild: Secondary | ICD-10-CM | POA: Diagnosis not present

## 2021-11-15 DIAGNOSIS — F5102 Adjustment insomnia: Secondary | ICD-10-CM | POA: Diagnosis not present

## 2021-11-15 DIAGNOSIS — F418 Other specified anxiety disorders: Secondary | ICD-10-CM | POA: Diagnosis not present

## 2022-01-07 DIAGNOSIS — E039 Hypothyroidism, unspecified: Secondary | ICD-10-CM | POA: Diagnosis not present

## 2022-03-11 DIAGNOSIS — E039 Hypothyroidism, unspecified: Secondary | ICD-10-CM | POA: Diagnosis not present

## 2022-06-09 DIAGNOSIS — F17200 Nicotine dependence, unspecified, uncomplicated: Secondary | ICD-10-CM | POA: Diagnosis not present

## 2022-06-09 DIAGNOSIS — I700 Atherosclerosis of aorta: Secondary | ICD-10-CM | POA: Diagnosis not present

## 2022-06-09 DIAGNOSIS — Z5181 Encounter for therapeutic drug level monitoring: Secondary | ICD-10-CM | POA: Diagnosis not present

## 2022-06-09 DIAGNOSIS — E039 Hypothyroidism, unspecified: Secondary | ICD-10-CM | POA: Diagnosis not present

## 2022-06-09 DIAGNOSIS — E782 Mixed hyperlipidemia: Secondary | ICD-10-CM | POA: Diagnosis not present

## 2022-06-09 DIAGNOSIS — I10 Essential (primary) hypertension: Secondary | ICD-10-CM | POA: Diagnosis not present

## 2022-06-13 DIAGNOSIS — B029 Zoster without complications: Secondary | ICD-10-CM | POA: Diagnosis not present

## 2022-06-30 DIAGNOSIS — K5730 Diverticulosis of large intestine without perforation or abscess without bleeding: Secondary | ICD-10-CM | POA: Diagnosis not present

## 2022-06-30 DIAGNOSIS — E039 Hypothyroidism, unspecified: Secondary | ICD-10-CM | POA: Diagnosis not present

## 2022-06-30 DIAGNOSIS — R1030 Lower abdominal pain, unspecified: Secondary | ICD-10-CM | POA: Diagnosis not present

## 2022-06-30 DIAGNOSIS — K3532 Acute appendicitis with perforation and localized peritonitis, without abscess: Secondary | ICD-10-CM | POA: Diagnosis not present

## 2022-06-30 DIAGNOSIS — K760 Fatty (change of) liver, not elsewhere classified: Secondary | ICD-10-CM | POA: Diagnosis not present

## 2022-06-30 DIAGNOSIS — R1031 Right lower quadrant pain: Secondary | ICD-10-CM | POA: Diagnosis not present

## 2022-06-30 DIAGNOSIS — Z9079 Acquired absence of other genital organ(s): Secondary | ICD-10-CM | POA: Diagnosis not present

## 2022-06-30 DIAGNOSIS — R109 Unspecified abdominal pain: Secondary | ICD-10-CM | POA: Diagnosis not present

## 2022-06-30 DIAGNOSIS — K1120 Sialoadenitis, unspecified: Secondary | ICD-10-CM | POA: Diagnosis not present

## 2022-06-30 DIAGNOSIS — R1032 Left lower quadrant pain: Secondary | ICD-10-CM | POA: Diagnosis not present

## 2022-06-30 DIAGNOSIS — F17210 Nicotine dependence, cigarettes, uncomplicated: Secondary | ICD-10-CM | POA: Diagnosis not present

## 2022-06-30 DIAGNOSIS — K37 Unspecified appendicitis: Secondary | ICD-10-CM | POA: Diagnosis not present

## 2022-06-30 DIAGNOSIS — R197 Diarrhea, unspecified: Secondary | ICD-10-CM | POA: Diagnosis not present

## 2022-06-30 DIAGNOSIS — Z79899 Other long term (current) drug therapy: Secondary | ICD-10-CM | POA: Diagnosis not present

## 2022-06-30 DIAGNOSIS — K388 Other specified diseases of appendix: Secondary | ICD-10-CM | POA: Diagnosis not present

## 2022-06-30 DIAGNOSIS — R1084 Generalized abdominal pain: Secondary | ICD-10-CM | POA: Diagnosis not present

## 2022-06-30 DIAGNOSIS — E7800 Pure hypercholesterolemia, unspecified: Secondary | ICD-10-CM | POA: Diagnosis not present

## 2022-06-30 DIAGNOSIS — K353 Acute appendicitis with localized peritonitis, without perforation or gangrene: Secondary | ICD-10-CM | POA: Diagnosis not present

## 2022-06-30 DIAGNOSIS — F419 Anxiety disorder, unspecified: Secondary | ICD-10-CM | POA: Diagnosis not present

## 2022-06-30 DIAGNOSIS — K3580 Unspecified acute appendicitis: Secondary | ICD-10-CM | POA: Diagnosis not present

## 2022-06-30 DIAGNOSIS — E278 Other specified disorders of adrenal gland: Secondary | ICD-10-CM | POA: Diagnosis not present

## 2022-06-30 DIAGNOSIS — Z8619 Personal history of other infectious and parasitic diseases: Secondary | ICD-10-CM | POA: Diagnosis not present

## 2022-06-30 DIAGNOSIS — I10 Essential (primary) hypertension: Secondary | ICD-10-CM | POA: Diagnosis not present

## 2022-06-30 DIAGNOSIS — Z79890 Hormone replacement therapy (postmenopausal): Secondary | ICD-10-CM | POA: Diagnosis not present

## 2022-08-23 DIAGNOSIS — F439 Reaction to severe stress, unspecified: Secondary | ICD-10-CM | POA: Diagnosis not present

## 2022-08-23 DIAGNOSIS — F4323 Adjustment disorder with mixed anxiety and depressed mood: Secondary | ICD-10-CM | POA: Diagnosis not present

## 2022-08-23 DIAGNOSIS — E039 Hypothyroidism, unspecified: Secondary | ICD-10-CM | POA: Diagnosis not present

## 2022-08-23 DIAGNOSIS — I10 Essential (primary) hypertension: Secondary | ICD-10-CM | POA: Diagnosis not present

## 2022-10-26 DIAGNOSIS — F17200 Nicotine dependence, unspecified, uncomplicated: Secondary | ICD-10-CM | POA: Diagnosis not present

## 2022-10-26 DIAGNOSIS — I10 Essential (primary) hypertension: Secondary | ICD-10-CM | POA: Diagnosis not present

## 2022-10-26 DIAGNOSIS — E039 Hypothyroidism, unspecified: Secondary | ICD-10-CM | POA: Diagnosis not present

## 2022-10-26 DIAGNOSIS — E782 Mixed hyperlipidemia: Secondary | ICD-10-CM | POA: Diagnosis not present

## 2022-11-03 ENCOUNTER — Other Ambulatory Visit: Payer: Self-pay | Admitting: Internal Medicine

## 2022-11-03 DIAGNOSIS — Z1231 Encounter for screening mammogram for malignant neoplasm of breast: Secondary | ICD-10-CM

## 2022-12-08 DIAGNOSIS — G5622 Lesion of ulnar nerve, left upper limb: Secondary | ICD-10-CM | POA: Diagnosis not present

## 2022-12-08 DIAGNOSIS — E669 Obesity, unspecified: Secondary | ICD-10-CM | POA: Diagnosis not present

## 2023-01-04 DIAGNOSIS — G5622 Lesion of ulnar nerve, left upper limb: Secondary | ICD-10-CM | POA: Diagnosis not present

## 2023-01-10 ENCOUNTER — Inpatient Hospital Stay: Admission: RE | Admit: 2023-01-10 | Payer: BC Managed Care – PPO | Source: Ambulatory Visit

## 2023-01-18 DIAGNOSIS — G5622 Lesion of ulnar nerve, left upper limb: Secondary | ICD-10-CM | POA: Diagnosis not present

## 2023-01-25 DIAGNOSIS — G5622 Lesion of ulnar nerve, left upper limb: Secondary | ICD-10-CM | POA: Diagnosis not present

## 2023-06-15 ENCOUNTER — Other Ambulatory Visit: Payer: Self-pay | Admitting: Family Medicine

## 2023-06-15 DIAGNOSIS — Z1231 Encounter for screening mammogram for malignant neoplasm of breast: Secondary | ICD-10-CM
